# Patient Record
Sex: Male | Born: 1995 | Hispanic: Yes | Marital: Single | Smoking: Never smoker
Health system: Southern US, Community
[De-identification: ages and names within clinical notes are randomized; demographics above are authoritative.]

---

## 2021-05-18 ENCOUNTER — Emergency Department (HOSPITAL_BASED_OUTPATIENT_CLINIC_OR_DEPARTMENT_OTHER): Payer: PRIVATE HEALTH INSURANCE

## 2021-05-18 ENCOUNTER — Other Ambulatory Visit: Payer: Self-pay

## 2021-05-18 ENCOUNTER — Emergency Department (HOSPITAL_BASED_OUTPATIENT_CLINIC_OR_DEPARTMENT_OTHER)
Admission: EM | Admit: 2021-05-18 | Discharge: 2021-05-18 | Disposition: A | Discharge status: Home / Self Care | Payer: PRIVATE HEALTH INSURANCE | Attending: Emergency Medicine | Admitting: Emergency Medicine

## 2021-05-18 ENCOUNTER — Encounter (HOSPITAL_BASED_OUTPATIENT_CLINIC_OR_DEPARTMENT_OTHER): Payer: Self-pay | Admitting: *Deleted

## 2021-05-18 DIAGNOSIS — W01198A Fall on same level from slipping, tripping and stumbling with subsequent striking against other object, initial encounter: Secondary | ICD-10-CM | POA: Diagnosis not present

## 2021-05-18 DIAGNOSIS — S80812A Abrasion, left lower leg, initial encounter: Secondary | ICD-10-CM | POA: Insufficient documentation

## 2021-05-18 DIAGNOSIS — L03116 Cellulitis of left lower limb: Secondary | ICD-10-CM | POA: Diagnosis not present

## 2021-05-18 DIAGNOSIS — Y990 Civilian activity done for income or pay: Secondary | ICD-10-CM | POA: Insufficient documentation

## 2021-05-18 DIAGNOSIS — S8992XA Unspecified injury of left lower leg, initial encounter: Secondary | ICD-10-CM | POA: Diagnosis present

## 2021-05-18 MED ORDER — DOXYCYCLINE HYCLATE 100 MG PO TABS
100.0000 mg | ORAL_TABLET | Freq: Once | ORAL | Status: AC
  Administered 2021-05-18: 100 mg via ORAL
  Filled 2021-05-18: qty 1

## 2021-05-18 MED ORDER — DOXYCYCLINE HYCLATE 100 MG PO CAPS: 100.0000 mg | ORAL_CAPSULE | Freq: Two times a day (BID) | ORAL | 0 refills | Status: AC

## 2021-05-18 NOTE — Discharge Instructions (Addendum)
I prescribed you an antibiotic called doxycycline.  Please take this twice a day for the next 10 days.  Do not stop taking this medication early.  If you develop worsening redness, swelling, fevers, vomiting, please come back to the emergency department immediately for reevaluation.  It was a pleasure to meet you.

## 2021-05-18 NOTE — ED Triage Notes (Signed)
4 days ago he tripped and hit his left lower leg. Abrasion noted on his lower leg.

## 2021-05-18 NOTE — ED Notes (Signed)
Pt ambulatory to bathroom without difficulty.  

## 2021-05-18 NOTE — ED Provider Notes (Signed)
[facility] EMERGENCY DEPARTMENT Provider Note   CSN: [id number] Arrival date & time: 05/18/21  1720     History Chief Complaint  Patient presents with   Leg Injury    Charles Flynn is a 25 y.o. male.  HPI Patient is a 25 year old male who presents to the emergency department due to left shin pain.  Patient states that he was at work 3 days ago and tripped and struck his shin and on a piece of metal on a machine.  He began having pain and swelling in the region that has worsened over the past 1 to 2 days.  Reports associated redness as well as blistering in the region.  Denies any numbness, tingling, weakness, fevers, chills, nausea, or vomiting.  States that his Tdap is up-to-date.    History reviewed. No pertinent past medical history.  There are no problems to display for this patient.   History reviewed. No pertinent surgical history.     No family history on file.  Social History   Tobacco Use   Smoking status: Never   Smokeless tobacco: Never  Vaping Use   Vaping Use: Never used  Substance Use Topics   Alcohol use: Never   Drug use: Never    Home Medications Prior to Admission medications   Medication Sig Start Date End Date Taking? Authorizing Provider  doxycycline (VIBRAMYCIN) 100 MG capsule Take 1 capsule (100 mg total) by mouth 2 (two) times daily for 10 days. 05/18/21 05/28/21 Yes Placido Sou, PA-C    Allergies    Patient has no known allergies.  Review of Systems   Review of Systems  Constitutional:  Negative for chills and fever.  Gastrointestinal:  Negative for nausea and vomiting.  Musculoskeletal:  Positive for myalgias.  Skin:  Positive for color change and wound.  Neurological:  Negative for weakness and numbness.   Physical Exam Updated Vital Signs BP 123/72 (BP Location: Right Arm)   Pulse 64   Temp 97.6 F (36.4 C) (Oral)   Resp 14   Ht 5\' 8"  (1.727 m)   Wt 73.9 kg   SpO2 100%   BMI 24.78 kg/m   Physical  Exam Vitals and nursing note reviewed.  Constitutional:      General: He is not in acute distress.    Appearance: Normal appearance. He is well-developed.  HENT:     Head: Normocephalic and atraumatic.     Right Ear: External ear normal.     Left Ear: External ear normal.  Eyes:     General: No scleral icterus.       Right eye: No discharge.        Left eye: No discharge.     Conjunctiva/sclera: Conjunctivae normal.  Neck:     Trachea: No tracheal deviation.  Cardiovascular:     Rate and Rhythm: Normal rate.  Pulmonary:     Effort: Pulmonary effort is normal. No respiratory distress.     Breath sounds: No stridor.  Abdominal:     General: There is no distension.  Musculoskeletal:        General: Swelling and tenderness present. No deformity.     Cervical back: Neck supple.     Comments: 5 to 6 cm healing abrasion noted to the left shin.  Mild tenderness at the site with moderate swelling.  1 to 2 cm of surrounding erythema with small unruptured blisters overlying erythema.  Skin:    General: Skin is warm and dry.  Findings: Erythema present. No rash.  Neurological:     Mental Status: He is alert.     Cranial Nerves: Cranial nerve deficit: no gross deficits.   ED Results / Procedures / Treatments   Labs (all labs ordered are listed, but only abnormal results are displayed) Labs Reviewed - No data to display  EKG None  Radiology DG Tibia/Fibula Left  Result Date: 05/18/2021 CLINICAL DATA:  left shin injury; possible developing infection EXAM: LEFT TIBIA AND FIBULA - 2 VIEW COMPARISON:  None. FINDINGS: There is no evidence of fracture or other focal bone lesions. Soft tissues are unremarkable. IMPRESSION: Negative. Electronically Signed   By: Deatra Robinson M.D.   On: 05/18/2021 21:05    Procedures Procedures   Medications Ordered in ED Medications  doxycycline (VIBRA-TABS) tablet 100 mg (100 mg Oral Given 05/18/21 2216)    ED Course  I have reviewed the triage  vital signs and the nursing notes.  Pertinent labs & imaging results that were available during my care of the patient were reviewed by me and considered in my medical decision making (see chart for details).    MDM Rules/Calculators/A&P                          Patient is a 26 year old male who presents to the emergency department due to what appears to be a developing cellulitis of the left shin.  Patient was at work and struck the shin on a piece of metal on one of the machines.  He has been developing swelling as well as redness in the region.  There is also a small amount of blistering around the wound.  Obtained an x-ray which was negative.  Patient denies any systemic symptoms such as fevers, chills, nausea, or vomiting.  He is afebrile and not tachycardic.  States his Tdap is up-to-date.  Will discharge on a course of doxycycline.  First dose given in the emergency department.  Discussed signs and symptoms of worsening infection and patient knows to come back to the emergency department if any of these should develop.  Feel he is stable for discharge at this time and he is agreeable.  His questions were answered and he was amicable at the time of discharge.  Final Clinical Impression(s) / ED Diagnoses Final diagnoses:  Cellulitis of left lower extremity   Rx / DC Orders ED Discharge Orders          Ordered    doxycycline (VIBRAMYCIN) 100 MG capsule  2 times daily        05/18/21 2203             Placido Sou, PA-C 05/18/21 2359    Charlynne Pander, MD 05/21/21 1450

## 2021-12-13 IMAGING — DX DG TIBIA/FIBULA 2V*L*
4 series · 4 of 4 positions shown · non-contrast
Comparison: None.

CLINICAL DATA: left shin injury; possible developing infection

EXAM:
LEFT TIBIA AND FIBULA - 2 VIEW

[tibia ap (1 of 2)]
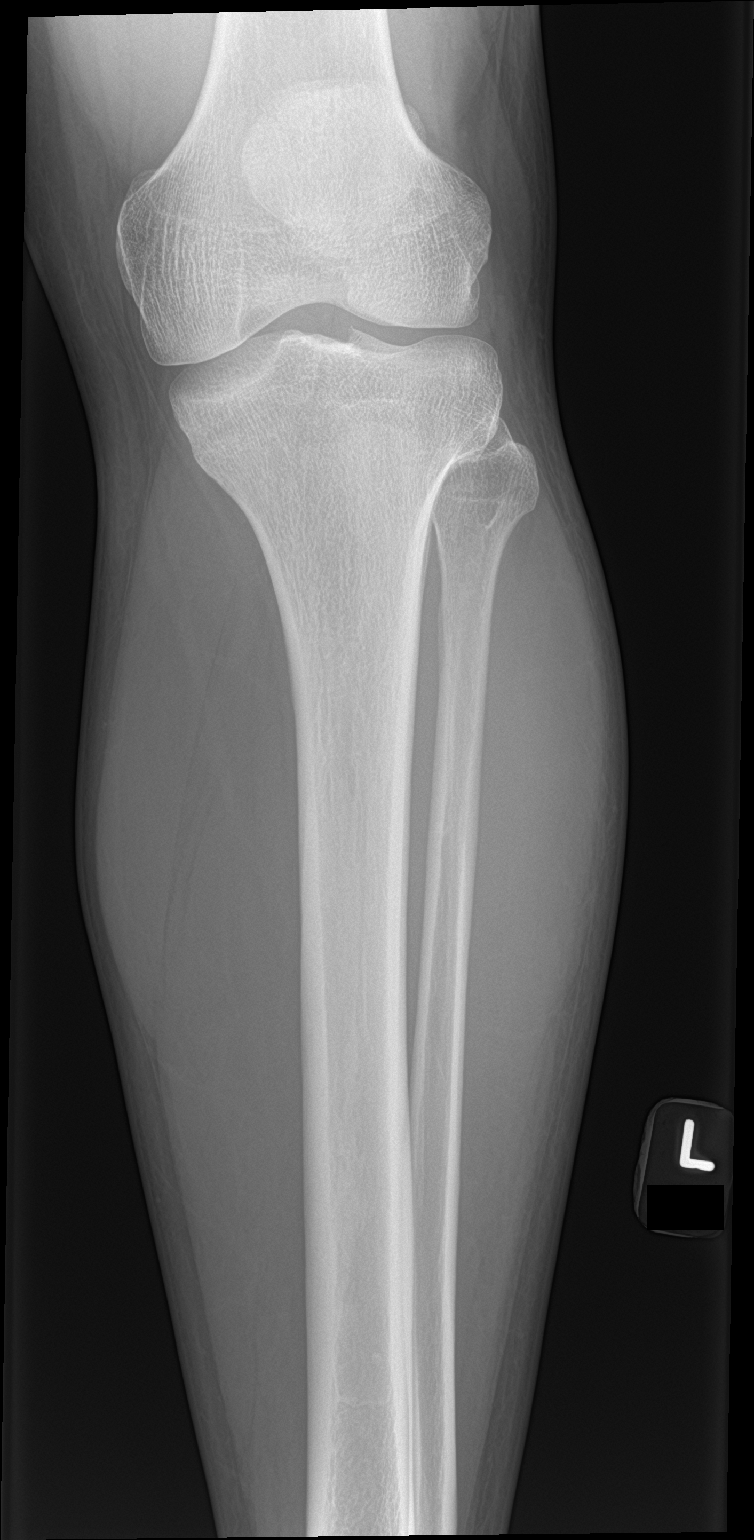

[tibia ap (2 of 2)]
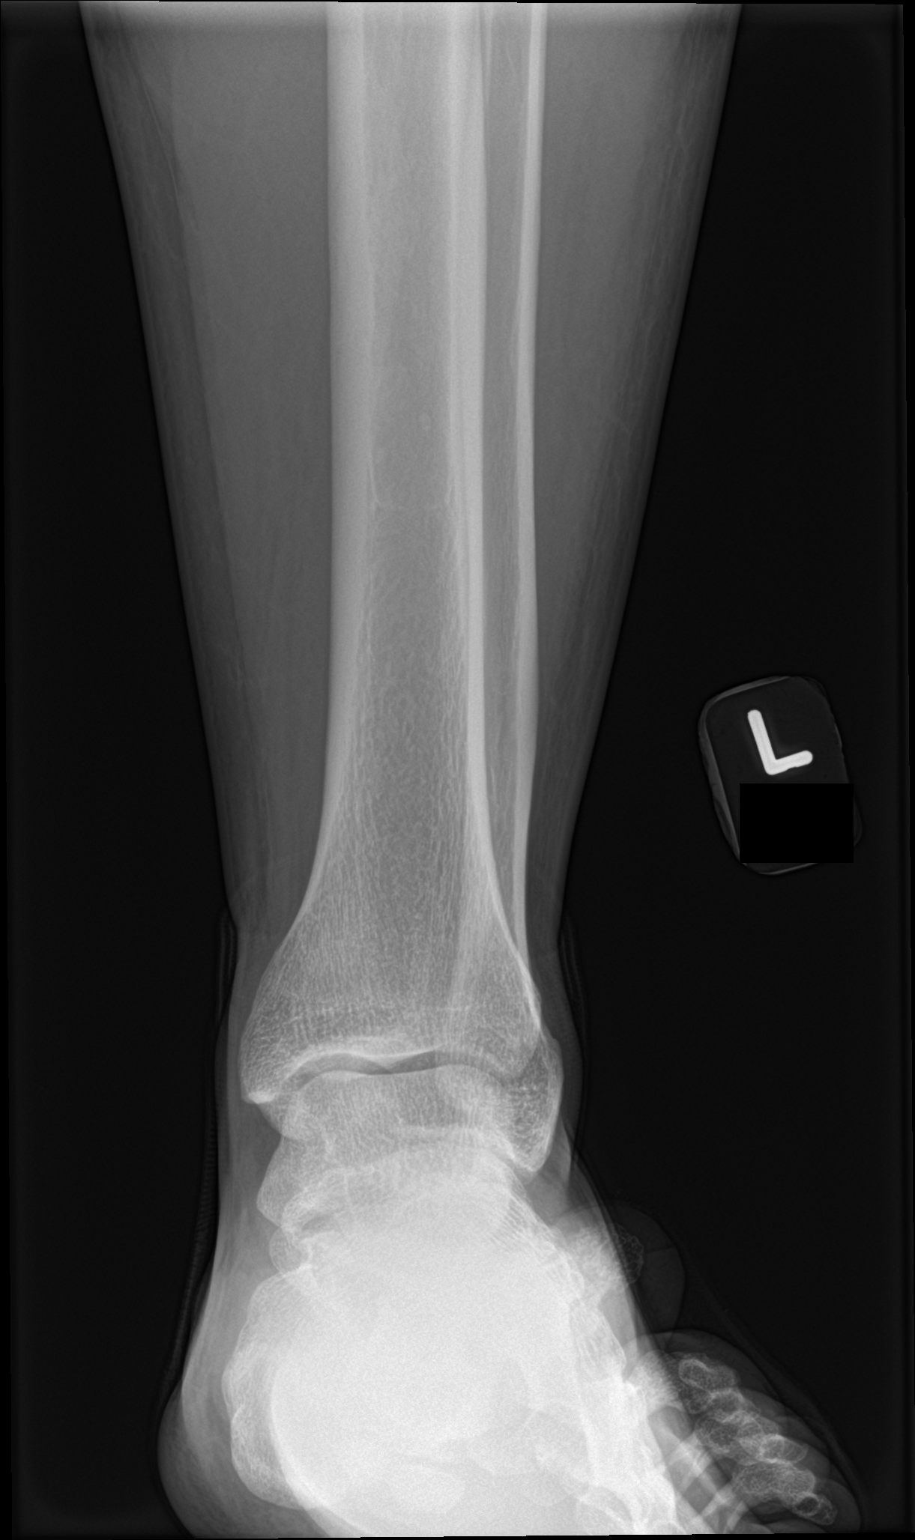

[tibia lat (1 of 2)]
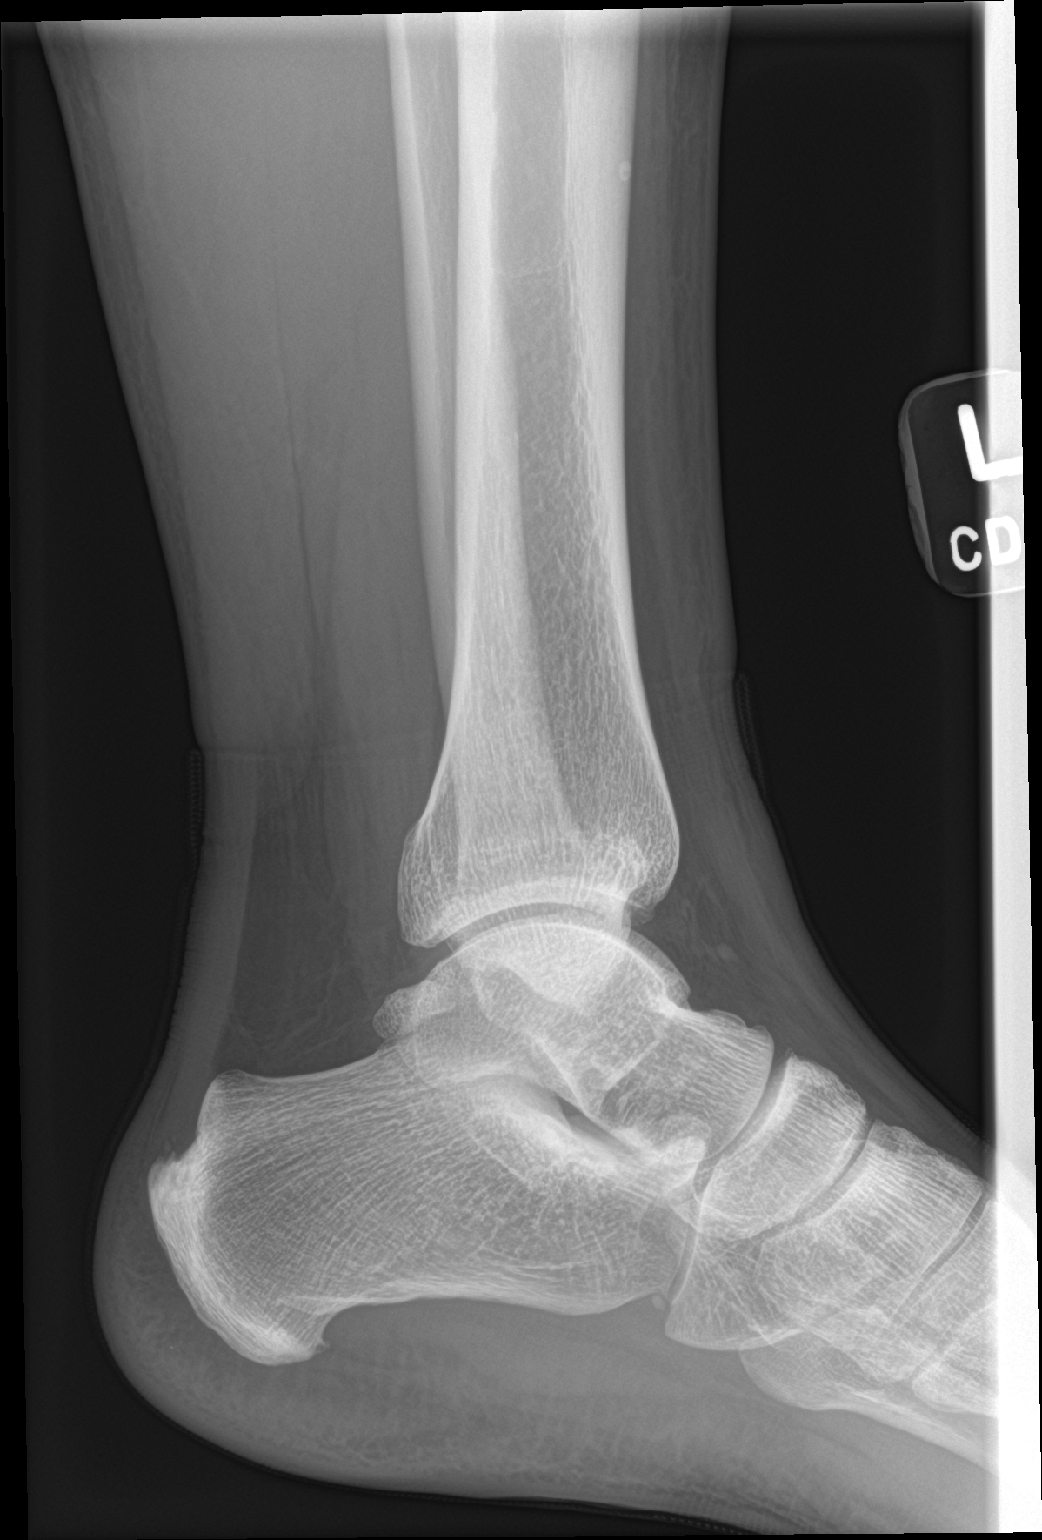

[tibia lat (2 of 2)]
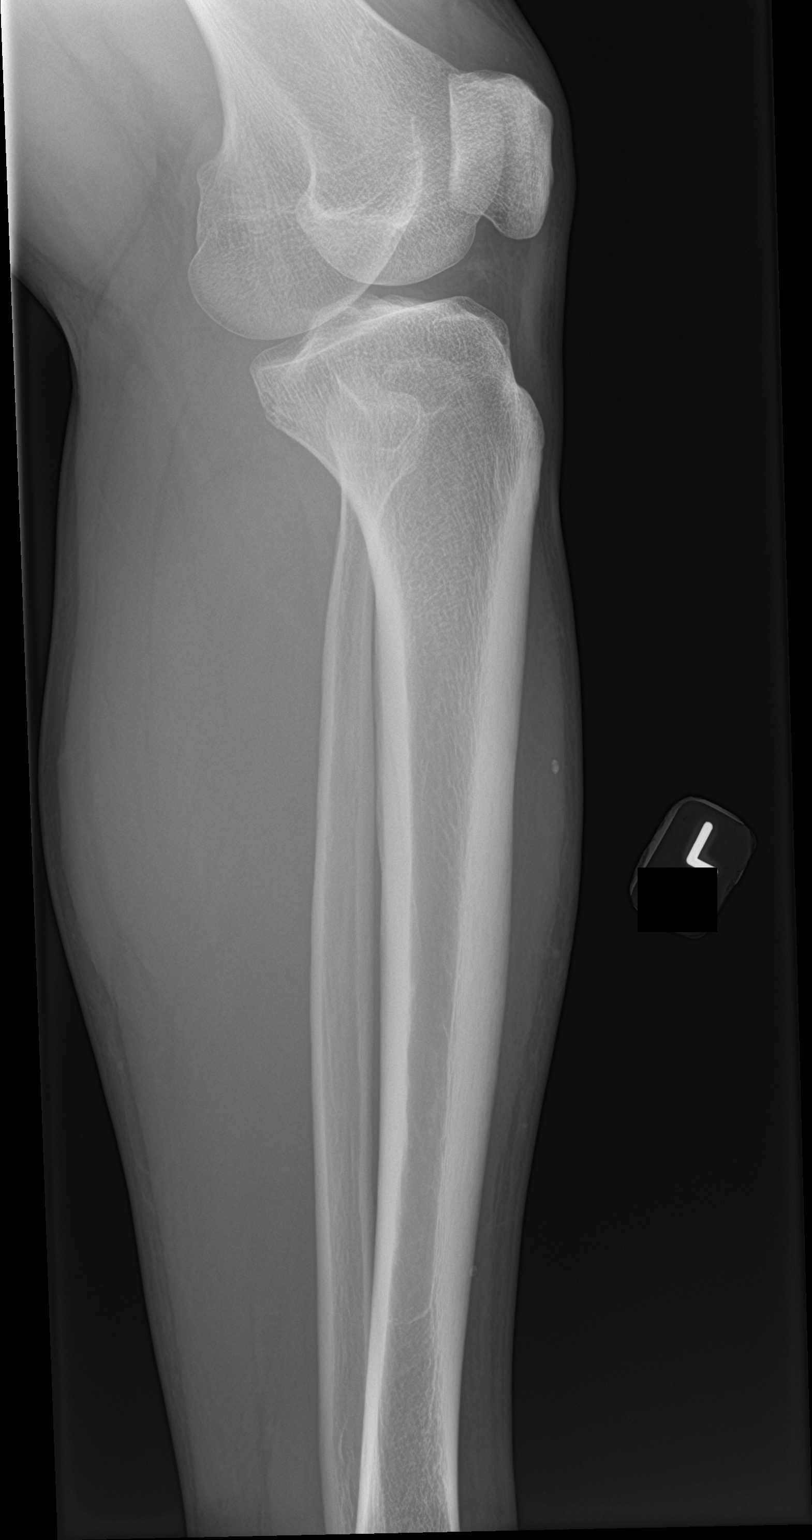

[4 of 4 positions shown; findings below may reference images not displayed]

FINDINGS: There is no evidence of fracture or other focal bone lesions. Soft
tissues are unremarkable.
IMPRESSION: Negative.
